# Patient Record
Sex: Male | Born: 1980 | Race: White | Hispanic: No | Marital: Single | Smoking: Current every day smoker
Health system: Southern US, Community
[De-identification: ages and names within clinical notes are randomized; demographics above are authoritative.]

## PROBLEM LIST (undated history)

## (undated) DIAGNOSIS — Z8669 Personal history of other diseases of the nervous system and sense organs: Secondary | ICD-10-CM

## (undated) DIAGNOSIS — M549 Dorsalgia, unspecified: Secondary | ICD-10-CM

## (undated) DIAGNOSIS — G8929 Other chronic pain: Secondary | ICD-10-CM

## (undated) DIAGNOSIS — R200 Anesthesia of skin: Secondary | ICD-10-CM

## (undated) DIAGNOSIS — Z789 Other specified health status: Secondary | ICD-10-CM

---

## 2013-07-04 ENCOUNTER — Other Ambulatory Visit (HOSPITAL_COMMUNITY): Payer: Self-pay | Admitting: Orthopaedic Surgery

## 2013-07-05 ENCOUNTER — Other Ambulatory Visit (HOSPITAL_COMMUNITY): Payer: Self-pay | Admitting: Orthopaedic Surgery

## 2013-07-16 ENCOUNTER — Encounter (HOSPITAL_COMMUNITY)
Admission: RE | Admit: 2013-07-16 | Discharge: 2013-07-16 | Disposition: A | Discharge status: Home / Self Care | Payer: Managed Care, Other (non HMO) | Source: Ambulatory Visit | Attending: Orthopaedic Surgery | Admitting: Orthopaedic Surgery

## 2013-07-16 ENCOUNTER — Encounter (HOSPITAL_COMMUNITY): Payer: Self-pay

## 2013-07-16 ENCOUNTER — Encounter (HOSPITAL_COMMUNITY): Payer: Self-pay | Admitting: Pharmacy Technician

## 2013-07-16 DIAGNOSIS — Z01818 Encounter for other preprocedural examination: Secondary | ICD-10-CM | POA: Insufficient documentation

## 2013-07-16 DIAGNOSIS — Z01812 Encounter for preprocedural laboratory examination: Secondary | ICD-10-CM | POA: Insufficient documentation

## 2013-07-16 HISTORY — DX: Anesthesia of skin: R20.0

## 2013-07-16 HISTORY — DX: Dorsalgia, unspecified: M54.9

## 2013-07-16 HISTORY — DX: Personal history of other diseases of the nervous system and sense organs: Z86.69

## 2013-07-16 HISTORY — DX: Other chronic pain: G89.29

## 2013-07-16 LAB — SURGICAL PCR SCREEN
MRSA, PCR: NEGATIVE
Staphylococcus aureus: NEGATIVE

## 2013-07-16 LAB — COMPREHENSIVE METABOLIC PANEL
ALT: 22 U/L (ref 0–53)
Alkaline Phosphatase: 34 U/L — ABNORMAL LOW (ref 39–117)
CO2: 28 mEq/L (ref 19–32)
GFR calc Af Amer: >90 mL/min (ref >90)
GFR calc non Af Amer: >90 mL/min (ref >90)
Glucose, Bld: 64 mg/dL — ABNORMAL LOW (ref 70–99)
Potassium: 4.1 mEq/L (ref 3.5–5.1)
Sodium: 139 mEq/L (ref 135–145)

## 2013-07-16 LAB — CBC
Hemoglobin: 16.2 g/dL (ref 13.0–17.0)
RBC: 5.18 MIL/uL (ref 4.22–5.81)

## 2013-07-16 NOTE — Pre-Procedure Instructions (Signed)
Caleb Vazquez  07/16/2013   Your procedure is scheduled on:  Fri, Sept 26 @ 10:35 AM  Report to [facility] at 8:30 AM.  Call this number if you have problems the morning of surgery: [phone]   Remember:   Do not eat food or drink liquids after midnight.   Take these medicines the morning of surgery with A SIP OF WATER: Ultram(Tramadol)              No Goody's,BC's,Aspirin,Aleve,Ibuprofen,Fish Oil,or any Herbal Medications   Do not wear jewelry  Do not wear lotions, powders, or colognes. You may wear deodorant.  Men may shave face and neck.  Do not bring valuables to the hospital.  [facility] is not responsible                   for any belongings or valuables.  Contacts, dentures or bridgework may not be worn into surgery.  Leave suitcase in the car. After surgery it may be brought to your room.  For patients admitted to the hospital, checkout time is 11:00 AM the day of  discharge.   Patients discharged the day of surgery will not be allowed to drive  home.    Special Instructions: Shower using CHG 2 nights before surgery and the night before surgery.  If you shower the day of surgery use CHG.  Use special wash - you have one bottle of CHG for all showers.  You should use approximately 1/3 of the bottle for each shower.   Please read over the following fact sheets that you were given: Pain Booklet, Coughing and Deep Breathing, MRSA Information and Surgical Site Infection Prevention

## 2013-07-16 NOTE — Progress Notes (Signed)
Pt doesn't have a cardiologist  Denies ever having a stress test/echo/heart cath  Pt doesn't have a medical md  Denies ekg or cxr in past yr

## 2013-07-17 ENCOUNTER — Other Ambulatory Visit (HOSPITAL_COMMUNITY): Payer: Self-pay

## 2013-07-22 NOTE — H&P (Signed)
  [facility]   A Division of [employer] [facility]   [address] [place], [place] [address] Telephone: [phone]  Fax: [phone]     PATIENT: Caleb Vazquez, Caleb Vazquez   MR#: [id number]  DOB: [birth year]       He is having persistent problems with his back.  He states he really has not gotten much relief.  He gets some temporary improvement with therapy.  Several hours later he gets more pain.  Pain is in the back, lumbosacral junction.  He had some problems with occasional back pain in the past but never associated with leg pain like he is having with the right buttock pain and posterolateral thigh pain, and pain intermittently radiates down past his knee down to his ankle.  He is able to heel and toe walk.  He is comfortable when he is in the supine position.  He has continued to work. He has had an epidural steroid injection, got relief for a couple of days to some degree, still had weakness in his leg and has trouble going up and down steps and then states he has had progressive worsening pain, continued numbness and weakness in his leg, painless straight leg raising.  He has continued having symptoms now for 4 months.  He has run out of his Ultram and requests an increase of the dosage.  We will send in 40 tablets 1-2 every 6 hours as needed for pain.  He has a large disk herniation on the right at L5-S1 with significant compression. IMAGING:  MRI scan shows a large disk herniation at L5-S1 on the right with significant compression of the right S1 nerve root consistent with his symptoms.  He has a shallow disk protrusion at L4-5 without compression.       ALLERGIES:  He has no allergies.   PAST MEDICAL/SURGICAL HISTORY:  No previous surgeries.   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  He is single.  Does not smoke, rarely drinks.     FAMILY HISTORY:  Positive for unknown type of cancer.    PHYSICAL EXAMINATION:  Patient is  alert and oriented, WD, WN, NAD, extraocular movement is intact.  Lungs are clear.  Abdomen is soft, nontender.  He is 6 feet 2 inches, thin, 140 pounds.  Lumbar skin is normal.  He has sciatic notch tenderness on the right.  Positive straight leg raising at 45 degrees.  He fatigues out with gastrocsoleus testing on the right after 2 ankle pumps.  He can do 30 easily with the left.  Positive popliteal compression test.  Peroneal is weak on the right only.  Anterior tib, EHL is normal.     ASSESSMENT:  Large HNP, L5-S1, failed conservative treatment, anti-inflammatories, pain medication, epidural steroid injections.  It is bothering him on a daily basis.  He still has been working.  He is on restrictions for limited bending, twisting, which is a problem for his job.     PLAN:  We discussed options.  He would like to discuss operative intervention with his family members.  He had a brother who had back surgery who got great relief, degenerative disk problems run in his family and MRI scan showed large disk herniation and compression.  Patient will proceed with right L5-S1 microdiscectomy.        Mark C. Ophelia Charter, M.D.    Auto-Authenticated by Veverly Fells. Ophelia Charter, M.D.

## 2013-07-25 MED ORDER — CEFAZOLIN SODIUM-DEXTROSE 2-3 GM-% IV SOLR
2.0000 g | INTRAVENOUS | Status: AC
  Administered 2013-07-26: 2 g via INTRAVENOUS
  Filled 2013-07-25: qty 50

## 2013-07-26 ENCOUNTER — Ambulatory Visit (HOSPITAL_COMMUNITY): Payer: Managed Care, Other (non HMO) | Admitting: Certified Registered Nurse Anesthetist

## 2013-07-26 ENCOUNTER — Encounter (HOSPITAL_COMMUNITY): Payer: Self-pay | Admitting: Certified Registered Nurse Anesthetist

## 2013-07-26 ENCOUNTER — Observation Stay (HOSPITAL_COMMUNITY)
Admission: RE | Admit: 2013-07-26 | Discharge: 2013-07-27 | Disposition: A | Discharge status: Home / Self Care | Payer: Managed Care, Other (non HMO) | Source: Ambulatory Visit | Attending: Orthopaedic Surgery | Admitting: Orthopaedic Surgery

## 2013-07-26 ENCOUNTER — Encounter (HOSPITAL_COMMUNITY)
Admission: RE | Disposition: A | Discharge status: Home / Self Care | Payer: Self-pay | Source: Ambulatory Visit | Attending: Orthopaedic Surgery

## 2013-07-26 ENCOUNTER — Ambulatory Visit (HOSPITAL_COMMUNITY): Payer: Managed Care, Other (non HMO)

## 2013-07-26 DIAGNOSIS — M5126 Other intervertebral disc displacement, lumbar region: Principal | ICD-10-CM | POA: Diagnosis present

## 2013-07-26 HISTORY — PX: LUMBAR LAMINECTOMY: SHX95

## 2013-07-26 HISTORY — DX: Other specified health status: Z78.9

## 2013-07-26 SURGERY — MICRODISCECTOMY LUMBAR LAMINECTOMY
Anesthesia: General | Site: Back | Laterality: Right | Wound class: Clean

## 2013-07-26 MED ORDER — BISACODYL 10 MG RE SUPP: 10.0000 mg | Freq: Every day | RECTAL | Status: DC | PRN

## 2013-07-26 MED ORDER — SODIUM CHLORIDE 0.9 % IV SOLN: 250.0000 mL | INTRAVENOUS | Status: DC

## 2013-07-26 MED ORDER — ACETAMINOPHEN 650 MG RE SUPP: 650.0000 mg | RECTAL | Status: DC | PRN

## 2013-07-26 MED ORDER — PROPOFOL 10 MG/ML IV BOLUS
INTRAVENOUS | Status: DC | PRN
  Administered 2013-07-26: 200 mg via INTRAVENOUS

## 2013-07-26 MED ORDER — CEFAZOLIN SODIUM 1-5 GM-% IV SOLN
1.0000 g | Freq: Three times a day (TID) | INTRAVENOUS | Status: AC
  Administered 2013-07-26 – 2013-07-27 (×2): 1 g via INTRAVENOUS
  Filled 2013-07-26 (×2): qty 50

## 2013-07-26 MED ORDER — SODIUM CHLORIDE 0.9 % IJ SOLN: 3.0000 mL | Freq: Two times a day (BID) | INTRAMUSCULAR | Status: DC

## 2013-07-26 MED ORDER — PROMETHAZINE HCL 25 MG/ML IJ SOLN: 6.2500 mg | INTRAMUSCULAR | Status: DC | PRN

## 2013-07-26 MED ORDER — ZOLPIDEM TARTRATE 5 MG PO TABS: 5.0000 mg | ORAL_TABLET | Freq: Every evening | ORAL | Status: DC | PRN

## 2013-07-26 MED ORDER — OXYCODONE HCL 5 MG PO TABS: 5.0000 mg | ORAL_TABLET | Freq: Once | ORAL | Status: DC | PRN

## 2013-07-26 MED ORDER — METHOCARBAMOL 100 MG/ML IJ SOLN
500.0000 mg | Freq: Four times a day (QID) | INTRAVENOUS | Status: DC | PRN
  Filled 2013-07-26: qty 5

## 2013-07-26 MED ORDER — HYDROCODONE-ACETAMINOPHEN 5-325 MG PO TABS: 1.0000 | ORAL_TABLET | ORAL | Status: DC | PRN

## 2013-07-26 MED ORDER — METHOCARBAMOL 500 MG PO TABS: 500.0000 mg | ORAL_TABLET | Freq: Four times a day (QID) | ORAL | Status: DC | PRN

## 2013-07-26 MED ORDER — 0.9 % SODIUM CHLORIDE (POUR BTL) OPTIME
TOPICAL | Status: DC | PRN
  Administered 2013-07-26: 1000 mL

## 2013-07-26 MED ORDER — KETOROLAC TROMETHAMINE 30 MG/ML IJ SOLN
30.0000 mg | Freq: Four times a day (QID) | INTRAMUSCULAR | Status: DC
  Administered 2013-07-26 – 2013-07-27 (×3): 30 mg via INTRAVENOUS
  Filled 2013-07-26 (×4): qty 1

## 2013-07-26 MED ORDER — ACETAMINOPHEN 325 MG PO TABS: 650.0000 mg | ORAL_TABLET | ORAL | Status: DC | PRN

## 2013-07-26 MED ORDER — SENNOSIDES-DOCUSATE SODIUM 8.6-50 MG PO TABS: 1.0000 | ORAL_TABLET | Freq: Every evening | ORAL | Status: DC | PRN

## 2013-07-26 MED ORDER — LACTATED RINGERS IV SOLN
INTRAVENOUS | Status: DC
  Administered 2013-07-26: 09:00:00 via INTRAVENOUS

## 2013-07-26 MED ORDER — LIDOCAINE HCL (CARDIAC) 20 MG/ML IV SOLN
INTRAVENOUS | Status: DC | PRN
  Administered 2013-07-26: 65 mg via INTRAVENOUS

## 2013-07-26 MED ORDER — MORPHINE SULFATE 2 MG/ML IJ SOLN: 1.0000 mg | INTRAMUSCULAR | Status: DC | PRN

## 2013-07-26 MED ORDER — PHENOL 1.4 % MT LIQD: 1.0000 | OROMUCOSAL | Status: DC | PRN

## 2013-07-26 MED ORDER — ALUM & MAG HYDROXIDE-SIMETH 200-200-20 MG/5ML PO SUSP: 30.0000 mL | Freq: Four times a day (QID) | ORAL | Status: DC | PRN

## 2013-07-26 MED ORDER — MIDAZOLAM HCL 5 MG/5ML IJ SOLN
INTRAMUSCULAR | Status: DC | PRN
  Administered 2013-07-26: 2 mg via INTRAVENOUS

## 2013-07-26 MED ORDER — DOCUSATE SODIUM 100 MG PO CAPS
100.0000 mg | ORAL_CAPSULE | Freq: Two times a day (BID) | ORAL | Status: DC
  Administered 2013-07-26 – 2013-07-27 (×2): 100 mg via ORAL
  Filled 2013-07-26 (×3): qty 1

## 2013-07-26 MED ORDER — LACTATED RINGERS IV SOLN
INTRAVENOUS | Status: DC | PRN
  Administered 2013-07-26 (×2): via INTRAVENOUS

## 2013-07-26 MED ORDER — ARTIFICIAL TEARS OP OINT
TOPICAL_OINTMENT | OPHTHALMIC | Status: DC | PRN
  Administered 2013-07-26: 1 via OPHTHALMIC

## 2013-07-26 MED ORDER — ONDANSETRON HCL 4 MG/2ML IJ SOLN
INTRAMUSCULAR | Status: DC | PRN
  Administered 2013-07-26: 4 mg via INTRAVENOUS

## 2013-07-26 MED ORDER — TRAMADOL HCL 50 MG PO TABS: 50.0000 mg | ORAL_TABLET | ORAL | Status: DC | PRN

## 2013-07-26 MED ORDER — OXYCODONE-ACETAMINOPHEN 5-325 MG PO TABS: 1.0000 | ORAL_TABLET | ORAL | Status: DC | PRN

## 2013-07-26 MED ORDER — BUPIVACAINE HCL (PF) 0.25 % IJ SOLN
INTRAMUSCULAR | Status: DC | PRN
  Administered 2013-07-26: 8 mL

## 2013-07-26 MED ORDER — SODIUM CHLORIDE 0.9 % IJ SOLN: 3.0000 mL | INTRAMUSCULAR | Status: DC | PRN

## 2013-07-26 MED ORDER — MENTHOL 3 MG MT LOZG: 1.0000 | LOZENGE | OROMUCOSAL | Status: DC | PRN

## 2013-07-26 MED ORDER — OXYCODONE-ACETAMINOPHEN 5-325 MG PO TABS
1.0000 | ORAL_TABLET | ORAL | Status: DC | PRN
  Administered 2013-07-26: 2 via ORAL
  Filled 2013-07-26: qty 2

## 2013-07-26 MED ORDER — PANTOPRAZOLE SODIUM 40 MG IV SOLR
40.0000 mg | Freq: Every day | INTRAVENOUS | Status: DC
  Administered 2013-07-26: 40 mg via INTRAVENOUS
  Filled 2013-07-26 (×2): qty 40

## 2013-07-26 MED ORDER — OXYCODONE HCL 5 MG/5ML PO SOLN: 5.0000 mg | Freq: Once | ORAL | Status: DC | PRN

## 2013-07-26 MED ORDER — GLYCOPYRROLATE 0.2 MG/ML IJ SOLN
INTRAMUSCULAR | Status: DC | PRN
  Administered 2013-07-26: .8 mg via INTRAVENOUS

## 2013-07-26 MED ORDER — BUPIVACAINE HCL (PF) 0.25 % IJ SOLN
INTRAMUSCULAR | Status: AC
  Filled 2013-07-26: qty 30

## 2013-07-26 MED ORDER — HYDROMORPHONE HCL PF 1 MG/ML IJ SOLN
0.2500 mg | INTRAMUSCULAR | Status: DC | PRN
  Administered 2013-07-26: 0.5 mg via INTRAVENOUS

## 2013-07-26 MED ORDER — KCL IN DEXTROSE-NACL 20-5-0.45 MEQ/L-%-% IV SOLN
INTRAVENOUS | Status: DC
  Filled 2013-07-26 (×4): qty 1000

## 2013-07-26 MED ORDER — FLEET ENEMA 7-19 GM/118ML RE ENEM: 1.0000 | ENEMA | Freq: Once | RECTAL | Status: AC | PRN

## 2013-07-26 MED ORDER — HYDROMORPHONE HCL PF 1 MG/ML IJ SOLN
INTRAMUSCULAR | Status: AC
  Administered 2013-07-26: 0.5 mg
  Filled 2013-07-26: qty 1

## 2013-07-26 MED ORDER — ROCURONIUM BROMIDE 100 MG/10ML IV SOLN
INTRAVENOUS | Status: DC | PRN
  Administered 2013-07-26: 50 mg via INTRAVENOUS

## 2013-07-26 MED ORDER — NEOSTIGMINE METHYLSULFATE 1 MG/ML IJ SOLN
INTRAMUSCULAR | Status: DC | PRN
  Administered 2013-07-26: 5 mg via INTRAVENOUS

## 2013-07-26 MED ORDER — FENTANYL CITRATE 0.05 MG/ML IJ SOLN
INTRAMUSCULAR | Status: DC | PRN
  Administered 2013-07-26: 50 ug via INTRAVENOUS
  Administered 2013-07-26: 100 ug via INTRAVENOUS

## 2013-07-26 MED ORDER — METOCLOPRAMIDE HCL 5 MG/ML IJ SOLN
INTRAMUSCULAR | Status: DC | PRN
  Administered 2013-07-26: 10 mg via INTRAVENOUS

## 2013-07-26 MED ORDER — ONDANSETRON HCL 4 MG/2ML IJ SOLN: 4.0000 mg | INTRAMUSCULAR | Status: DC | PRN

## 2013-07-26 SURGICAL SUPPLY — 51 items
BUR ROUND FLUTED 4 SOFT TCH (BURR) IMPLANT
CANISTER SUCTION 2500CC (MISCELLANEOUS) ×2 IMPLANT
CLOTH BEACON ORANGE TIMEOUT ST (SAFETY) IMPLANT
CORDS BIPOLAR (ELECTRODE) ×2 IMPLANT
COVER SURGICAL LIGHT HANDLE (MISCELLANEOUS) ×2 IMPLANT
DERMABOND ADVANCED (GAUZE/BANDAGES/DRESSINGS) ×1
DERMABOND ADVANCED .7 DNX12 (GAUZE/BANDAGES/DRESSINGS) ×1 IMPLANT
DRAPE MICROSCOPE LEICA (MISCELLANEOUS) ×2 IMPLANT
DRAPE PROXIMA HALF (DRAPES) ×4 IMPLANT
DRSG EMULSION OIL 3X3 NADH (GAUZE/BANDAGES/DRESSINGS) IMPLANT
DRSG MEPILEX BORDER 4X4 (GAUZE/BANDAGES/DRESSINGS) ×2 IMPLANT
DRSG MEPILEX BORDER 4X8 (GAUZE/BANDAGES/DRESSINGS) IMPLANT
DURAPREP 26ML APPLICATOR (WOUND CARE) ×2 IMPLANT
ELECT REM PT RETURN 9FT ADLT (ELECTROSURGICAL) ×2
ELECTRODE REM PT RTRN 9FT ADLT (ELECTROSURGICAL) ×1 IMPLANT
GLOVE BIOGEL PI IND STRL 7.5 (GLOVE) ×1 IMPLANT
GLOVE BIOGEL PI IND STRL 8 (GLOVE) ×1 IMPLANT
GLOVE BIOGEL PI INDICATOR 7.5 (GLOVE) ×1
GLOVE BIOGEL PI INDICATOR 8 (GLOVE) ×1
GLOVE ECLIPSE 7.0 STRL STRAW (GLOVE) ×2 IMPLANT
GLOVE ORTHO TXT STRL SZ7.5 (GLOVE) ×2 IMPLANT
GLOVE SURG SS PI 8.5 STRL IVOR (GLOVE) ×1
GLOVE SURG SS PI 8.5 STRL STRW (GLOVE) ×1 IMPLANT
GOWN PREVENTION PLUS LG XLONG (DISPOSABLE) ×2 IMPLANT
GOWN STRL NON-REIN LRG LVL3 (GOWN DISPOSABLE) ×2 IMPLANT
GOWN STRL REIN 2XL XLG LVL4 (GOWN DISPOSABLE) ×2 IMPLANT
KIT BASIN OR (CUSTOM PROCEDURE TRAY) ×2 IMPLANT
KIT ROOM TURNOVER OR (KITS) ×2 IMPLANT
MANIFOLD NEPTUNE II (INSTRUMENTS) IMPLANT
NDL SUT .5 MAYO 1.404X.05X (NEEDLE) IMPLANT
NEEDLE 22X1 1/2 (OR ONLY) (NEEDLE) ×2 IMPLANT
NEEDLE MAYO TAPER (NEEDLE)
NEEDLE SPNL 18GX3.5 QUINCKE PK (NEEDLE) ×2 IMPLANT
NS IRRIG 1000ML POUR BTL (IV SOLUTION) ×2 IMPLANT
PACK LAMINECTOMY ORTHO (CUSTOM PROCEDURE TRAY) ×2 IMPLANT
PAD ARMBOARD 7.5X6 YLW CONV (MISCELLANEOUS) ×4 IMPLANT
PATTIES SURGICAL .5 X.5 (GAUZE/BANDAGES/DRESSINGS) IMPLANT
PATTIES SURGICAL .75X.75 (GAUZE/BANDAGES/DRESSINGS) IMPLANT
SPONGE GAUZE 4X4 12PLY (GAUZE/BANDAGES/DRESSINGS) IMPLANT
SUT VIC AB 0 CT1 27 (SUTURE) ×1
SUT VIC AB 0 CT1 27XBRD ANBCTR (SUTURE) ×1 IMPLANT
SUT VIC AB 2-0 CT1 27 (SUTURE) ×1
SUT VIC AB 2-0 CT1 TAPERPNT 27 (SUTURE) ×1 IMPLANT
SUT VICRYL 0 TIES 12 18 (SUTURE) IMPLANT
SUT VICRYL 4-0 PS2 18IN ABS (SUTURE) ×2 IMPLANT
SUT VICRYL AB 2 0 TIES (SUTURE) IMPLANT
SYR 20ML ECCENTRIC (SYRINGE) IMPLANT
SYR CONTROL 10ML LL (SYRINGE) ×2 IMPLANT
TOWEL OR 17X24 6PK STRL BLUE (TOWEL DISPOSABLE) ×2 IMPLANT
TOWEL OR 17X26 10 PK STRL BLUE (TOWEL DISPOSABLE) ×2 IMPLANT
WATER STERILE IRR 1000ML POUR (IV SOLUTION) IMPLANT

## 2013-07-26 NOTE — Interval H&P Note (Signed)
History and Physical Interval Note:  07/26/2013 10:31 AM  Caleb Vazquez  has presented today for surgery, with the diagnosis of Right L5-S1 HNP  The various methods of treatment have been discussed with the patient and family. After consideration of risks, benefits and other options for treatment, the patient has consented to  Procedure(s) with comments: MICRODISCECTOMY LUMBAR LAMINECTOMY (Right) - Right L5-S1 Microdiscectomy as a surgical intervention .  The patient's history has been reviewed, patient examined, no change in status, stable for surgery.  I have reviewed the patient's chart and labs.  Questions were answered to the patient's satisfaction.     [name]

## 2013-07-26 NOTE — Preoperative (Signed)
Beta Blockers   Reason not to administer Beta Blockers:Not Applicable 

## 2013-07-26 NOTE — Transfer of Care (Signed)
Immediate Anesthesia Transfer of Care Note  Patient: Caleb Vazquez  Procedure(s) Performed: Procedure(s) with comments: MICRODISCECTOMY LUMBAR LAMINECTOMY (Right) - Right L5-S1 Microdiscectomy  Patient Location: PACU  Anesthesia Type:General  Level of Consciousness: awake, alert , oriented and patient cooperative  Airway & Oxygen Therapy: Patient Spontanous Breathing  Post-op Assessment: Report given to PACU RN, Post -op Vital signs reviewed and stable and Patient moving all extremities X 4  Post vital signs: Reviewed and stable  Complications: No apparent anesthesia complications

## 2013-07-26 NOTE — Anesthesia Preprocedure Evaluation (Signed)
Anesthesia Evaluation  Patient identified by MRN, date of birth, ID band Patient awake    Reviewed: Allergy & Precautions, H&P , NPO status , Patient's Chart, lab work & pertinent test results  History of Anesthesia Complications Negative for: history of anesthetic complications  Airway Mallampati: I      Dental  (+) Teeth Intact   Pulmonary  breath sounds clear to auscultation        Cardiovascular negative cardio ROS  Rhythm:Regular Rate:Normal     Neuro/Psych    GI/Hepatic negative GI ROS, Neg liver ROS,   Endo/Other  negative endocrine ROS  Renal/GU negative Renal ROS     Musculoskeletal negative musculoskeletal ROS (+)   Abdominal   Peds  Hematology negative hematology ROS (+)   Anesthesia Other Findings   Reproductive/Obstetrics                           Anesthesia Physical Anesthesia Plan  ASA: I  Anesthesia Plan: General   Post-op Pain Management:    Induction: Intravenous  Airway Management Planned: Oral ETT  Additional Equipment:   Intra-op Plan:   Post-operative Plan: Extubation in OR  Informed Consent: I have reviewed the patients History and Physical, chart, labs and discussed the procedure including the risks, benefits and alternatives for the proposed anesthesia with the patient or authorized representative who has indicated his/her understanding and acceptance.   Dental advisory given  Plan Discussed with: CRNA and Surgeon  Anesthesia Plan Comments:         Anesthesia Quick Evaluation

## 2013-07-26 NOTE — Brief Op Note (Cosign Needed)
07/26/2013  2:27 PM  PATIENT:  Caleb Vazquez  32 y.o. male  PRE-OPERATIVE DIAGNOSIS:  Right L5-S1 HNP  POST-OPERATIVE DIAGNOSIS:  Right L5-S1 HNP  PROCEDURE:  Procedure(s) with comments: MICRODISCECTOMY LUMBAR LAMINECTOMY (Right) - Right L5-S1 Microdiscectomy  SURGEON:  Surgeon(s) and Role:    * Eldred Manges, MD - Primary  PHYSICIAN ASSISTANT: Maud Deed PAC  ASSISTANTS: none   ANESTHESIA:   general  EBL:  Total I/O In: 1000 [I.V.:1000] Out: -   BLOOD ADMINISTERED:none  DRAINS: none   LOCAL MEDICATIONS USED:  MARCAINE     SPECIMEN:  No Specimen  DISPOSITION OF SPECIMEN:  N/A  COUNTS:  YES  TOURNIQUET:  * No tourniquets in log *  DICTATION: .Note written in EPIC  PLAN OF CARE: Admit for overnight observation  PATIENT DISPOSITION:  PACU - hemodynamically stable.   Delay start of Pharmacological VTE agent (>24hrs) due to surgical blood loss or risk of bleeding: yes

## 2013-07-26 NOTE — Anesthesia Postprocedure Evaluation (Signed)
Anesthesia Post Note  Patient: Caleb Vazquez  Procedure(s) Performed: Procedure(s) (LRB): MICRODISCECTOMY LUMBAR LAMINECTOMY (Right)  Anesthesia type: General  Patient location: PACU  Post pain: Pain level controlled and Adequate analgesia  Post assessment: Post-op Vital signs reviewed, Patient's Cardiovascular Status Stable, Respiratory Function Stable, Patent Airway and Pain level controlled  Last Vitals:  Filed Vitals:   07/26/13 1530  BP:   Pulse:   Temp: 36.9 C  Resp:     Post vital signs: Reviewed and stable  Level of consciousness: awake, alert  and oriented  Complications: No apparent anesthesia complications

## 2013-07-27 NOTE — Discharge Summary (Signed)
Physician Discharge Summary  Patient ID: Caleb Vazquez MRN: [id number] DOB/AGE: 02-08-1981 32 y.o.  Admit date: 07/26/2013 Discharge date: 07/27/2013  Admission Diagnoses: Right L5-S1 herniated nucleus pulposus with radiculopathy  Discharge Diagnoses: Same Principal Problem:   HNP (herniated nucleus pulposus), lumbar   Discharged Condition: Improved  Hospital Course: Patient underwent right L5-S1 microdiscectomy for removal of large disc herniation with severe compression. Postop complete relief of leg pain and weakness. He was ambulatory in the halls minimal incisional pain and had his dressing changed.  Consults: None  Significant Diagnostic Studies:   Treatments: surgery: As above  Discharge Exam: Blood pressure 118/69, pulse 56, temperature 98.3 F (36.8 C), temperature source Oral, resp. rate 16, SpO2 97.00%. Extremities: Negative straight leg raising 80 no isolated motor weakness normal sensation right and left lower extremity  Disposition: Final discharge disposition not confirmed     Medication List         methocarbamol 500 MG tablet  Commonly known as:  ROBAXIN  Take 1 tablet (500 mg total) by mouth every 6 (six) hours as needed (spasm).     oxyCODONE-acetaminophen 5-325 MG per tablet  Commonly known as:  ROXICET  Take 1-2 tablets by mouth every 4 (four) hours as needed for pain.     traMADol 50 MG tablet  Commonly known as:  ULTRAM  Take 50-100 mg by mouth every 4 (four) hours as needed for pain.           Follow-up Information   Follow up with Eldred Manges, MD. Schedule an appointment as soon as possible for a visit in 2 weeks.   Specialty:  Orthopedic Surgery   Contact information:   [address] [place] [address] [phone]       Signed: Eldred Manges 07/27/2013, 9:44 AM

## 2013-07-27 NOTE — Op Note (Signed)
NAMEMarland Vazquez  ORVIE, CARADINE NO.:  [id number]  MEDICAL RECORD NO.:  [id number]  LOCATION:  5N06C                        FACILITY:  [facility]  PHYSICIAN:  Mark C. Ophelia Charter, M.D.    DATE OF BIRTH:  04/10/1981  DATE OF PROCEDURE:  07/26/2013 DATE OF DISCHARGE:                              OPERATIVE REPORT   PREOPERATIVE DIAGNOSIS:  Right L5-S1 microdiskectomy for large herniated nucleus pulposus.  POSTOPERATIVE DIAGNOSIS:  Right L5-S1 microdiskectomy for large herniated nucleus pulposus.  SURGEON:  Mark C. Ophelia Charter, M.D.  ASSISTANT:  Maud Deed, PA-C, medically necessary and present for the entire procedure.  ANESTHESIA:  GOT plus Marcaine skin local.  COMPLICATIONS:  None.  INDICATION:  A 32 year old male with persistent problems with back and leg pain, very large extruded fragment occupying about half the canal at 5-1 level on the right side with persistent S1 radiculopathy present for more than 4 months with progressive increased weakness, pain, and numbness.  PROCEDURE:  After induction of general anesthesia, the patient was placed in a prone position.  Careful padding and positioning.  Back was prepped with DuraPrep.  The area was squared with towels, Betadine, Steri-Drape applied.  Needle localization was originally placed, but the x-ray machine was not functioning properly.  The patient was extremely thin, easy to palpate 5-1 interspace, and small incision was made after time-out procedure, just the right of midline.  Subperiosteal dissection, placement of a Taylor retractor laterally was performed.  At this time, machine was either corrected or another machine was brought in for x-ray and cross-table lateral x-ray confirmed that we were at the 5-1 level as expected.  Laminotomy was performed.  Ligament was opened and removed.  Nerve root was extremely tight and did not want to be mobilized out of its position where it was dorsally displaced and laterally  displaced.  Little bit more lamina was removed proximally and then following down to the floor for the L5 body posterior aspect of the body was followed to the level the disk space.  The area of disk was visualized, open slightly with the tip of a nerve hook, and immediately some disk material extruded.  This then gradually extended caudally and the very large fragment, which was 4 x 2 x 1.5 cm was gradually teased out from around the nerve root and delivered.  Remaining chunks were taken, but base of the clavicle large fragment was removed.  Nerve root was decompressed.  Foramina was opened.  Passes were made in the midline with minimal remaining disk material.  Hockey stick could be passed anterior to the dura 180 degrees without areas of compression.  Dura was intact.  The area was irrigated.  Some veins in the lateral gutter and the sac were coagulated.  No remaining areas of compression and there was no protrusion in the midline.  Repeat irrigation, fascia closure with #1 Vicryl, 2-0 Vicryl subcutaneous tissue, subcuticular skin closure, postop dressing Dermabond.  Transferred to the recovery room in stable condition.  Instrument count and needle count was correct.     Mark C. Ophelia Charter, M.D.     MCY/MEDQ  D:  07/26/2013  T:  07/27/2013  Job:  [id number]

## 2013-07-30 ENCOUNTER — Encounter (HOSPITAL_COMMUNITY): Payer: Self-pay | Admitting: Orthopaedic Surgery

## 2014-02-21 ENCOUNTER — Emergency Department (HOSPITAL_COMMUNITY): Payer: Managed Care, Other (non HMO)

## 2014-02-21 ENCOUNTER — Emergency Department (HOSPITAL_COMMUNITY)
Admission: EM | Admit: 2014-02-21 | Discharge: 2014-02-22 | Disposition: A | Discharge status: Home / Self Care | Payer: Managed Care, Other (non HMO) | Attending: Emergency Medicine | Admitting: Emergency Medicine

## 2014-02-21 ENCOUNTER — Encounter (HOSPITAL_COMMUNITY): Payer: Self-pay | Admitting: Emergency Medicine

## 2014-02-21 DIAGNOSIS — F17200 Nicotine dependence, unspecified, uncomplicated: Secondary | ICD-10-CM | POA: Insufficient documentation

## 2014-02-21 DIAGNOSIS — Y929 Unspecified place or not applicable: Secondary | ICD-10-CM | POA: Insufficient documentation

## 2014-02-21 DIAGNOSIS — Z23 Encounter for immunization: Secondary | ICD-10-CM | POA: Insufficient documentation

## 2014-02-21 DIAGNOSIS — Z8679 Personal history of other diseases of the circulatory system: Secondary | ICD-10-CM | POA: Insufficient documentation

## 2014-02-21 DIAGNOSIS — Z79899 Other long term (current) drug therapy: Secondary | ICD-10-CM | POA: Insufficient documentation

## 2014-02-21 DIAGNOSIS — S61459A Open bite of unspecified hand, initial encounter: Secondary | ICD-10-CM

## 2014-02-21 DIAGNOSIS — S62329B Displaced fracture of shaft of unspecified metacarpal bone, initial encounter for open fracture: Secondary | ICD-10-CM | POA: Insufficient documentation

## 2014-02-21 DIAGNOSIS — L0390 Cellulitis, unspecified: Secondary | ICD-10-CM

## 2014-02-21 DIAGNOSIS — G8929 Other chronic pain: Secondary | ICD-10-CM | POA: Insufficient documentation

## 2014-02-21 DIAGNOSIS — L02519 Cutaneous abscess of unspecified hand: Secondary | ICD-10-CM | POA: Insufficient documentation

## 2014-02-21 DIAGNOSIS — S62309B Unspecified fracture of unspecified metacarpal bone, initial encounter for open fracture: Secondary | ICD-10-CM

## 2014-02-21 DIAGNOSIS — L03119 Cellulitis of unspecified part of limb: Secondary | ICD-10-CM

## 2014-02-21 DIAGNOSIS — W540XXA Bitten by dog, initial encounter: Secondary | ICD-10-CM | POA: Insufficient documentation

## 2014-02-21 DIAGNOSIS — Y9389 Activity, other specified: Secondary | ICD-10-CM | POA: Insufficient documentation

## 2014-02-21 MED ORDER — IBUPROFEN 400 MG PO TABS
800.0000 mg | ORAL_TABLET | Freq: Once | ORAL | Status: AC
  Administered 2014-02-22: 800 mg via ORAL
  Filled 2014-02-21: qty 4

## 2014-02-21 MED ORDER — AMOXICILLIN-POT CLAVULANATE 875-125 MG PO TABS
1.0000 | ORAL_TABLET | Freq: Once | ORAL | Status: AC
  Administered 2014-02-22: 1 via ORAL
  Filled 2014-02-21: qty 1

## 2014-02-21 MED ORDER — TETANUS-DIPHTH-ACELL PERTUSSIS 5-2.5-18.5 LF-MCG/0.5 IM SUSP
0.5000 mL | Freq: Once | INTRAMUSCULAR | Status: AC
  Administered 2014-02-22: 0.5 mL via INTRAMUSCULAR
  Filled 2014-02-21: qty 0.5

## 2014-02-21 NOTE — ED Notes (Signed)
The pt lt hand was bitten by his girlfriends dog Wednesday night.  He has a puncture wound at the base of his lt ring finger  Mid shaft metacarpal and the movement of the lt ring and little fingers is decreased.  He is able to flex   And extend but not  Totally.  Minimal swelling  Hand color good
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
## 2014-02-21 NOTE — ED Notes (Signed)
The pt was bitten by a pit bull dog

## 2014-02-22 MED ORDER — IBUPROFEN 800 MG PO TABS: 800.0000 mg | ORAL_TABLET | Freq: Once | ORAL | Status: DC

## 2014-02-22 MED ORDER — AMOXICILLIN-POT CLAVULANATE 875-125 MG PO TABS: 1.0000 | ORAL_TABLET | Freq: Two times a day (BID) | ORAL | Status: DC

## 2014-02-22 NOTE — Discharge Instructions (Signed)
Animal Bite °An animal bite can result in a scratch on the skin, deep open cut, puncture of the skin, crush injury, or tearing away of the skin or a body part. Dogs are responsible for most animal bites. Children are bitten more often than adults. An animal bite can range from very mild to more serious. A small bite from your house pet is no cause for alarm. However, some animal bites can become infected or injure a bone or other tissue. You must seek medical care if: °· The skin is broken and bleeding does not slow down or stop after 15 minutes. °· The puncture is deep and difficult to clean (such as a cat bite). °· Pain, warmth, redness, or pus develops around the wound. °· The bite is from a stray animal or rodent. There may be a risk of rabies infection. °· The bite is from a snake, raccoon, skunk, fox, coyote, or bat. There may be a risk of rabies infection. °· The person bitten has a chronic illness such as diabetes, liver disease, or cancer, or the person takes medicine that lowers the immune system. °· There is concern about the location and severity of the bite. °It is important to clean and protect an animal bite wound right away to prevent infection. Follow these steps: °· Clean the wound with plenty of water and soap. °· Apply an antibiotic cream. °· Apply gentle pressure over the wound with a clean towel or gauze to slow or stop bleeding. °· Elevate the affected area above the heart to help stop any bleeding. °· Seek medical care. Getting medical care within 8 hours of the animal bite leads to the best possible outcome. °DIAGNOSIS  °Your caregiver will most likely: °· Take a detailed history of the animal and the bite injury. °· Perform a wound exam. °· Take your medical history. °Blood tests or X-rays may be performed. Sometimes, infected bite wounds are cultured and sent to a lab to identify the infectious bacteria.  °TREATMENT  °Medical treatment will depend on the location and type of animal bite as  well as the patient's medical history. Treatment may include: °· Wound care, such as cleaning and flushing the wound with saline solution, bandaging, and elevating the affected area. °· Antibiotics. °· Tetanus immunization. °· Rabies immunization. °· Leaving the wound open to heal. This is often done with animal bites, due to the high risk of infection. However, in certain cases, wound closure with stitches, wound adhesive, skin adhesive strips, or staples may be used. ° Infected bites that are left untreated may require intravenous (IV) antibiotics and surgical treatment in the hospital. °HOME CARE INSTRUCTIONS °· Follow your caregiver's instructions for wound care. °· Take all medicines as directed. °· If your caregiver prescribes antibiotics, take them as directed. Finish them even if you start to feel better. °· Follow up with your caregiver for further exams or immunizations as directed. °You may need a tetanus shot if: °· You cannot remember when you had your last tetanus shot. °· You have never had a tetanus shot. °· The injury broke your skin. °If you get a tetanus shot, your arm may swell, get red, and feel warm to the touch. This is common and not a problem. If you need a tetanus shot and you choose not to have one, there is a rare chance of getting tetanus. Sickness from tetanus can be serious. °SEEK MEDICAL CARE IF: °· You notice warmth, redness, soreness, swelling, pus discharge, or a bad   smell coming from the wound.  You have a red line on the skin coming from the wound.  You have a fever, chills, or a general ill feeling.  You have nausea or vomiting.  You have continued or worsening pain.  You have trouble moving the injured part.  You have other questions or concerns. MAKE SURE YOU:  Understand these instructions.  Will watch your condition.  Will get help right away if you are not doing well or get worse. Document Released: 07/05/2011 Document Revised: 01/09/2012 Document  Reviewed: 07/05/2011 [facility] Patient Information 2014 [place], [place]. Hand Fracture, Fifth Metacarpal The small metacarpal is the bone at the base of the little finger between the knuckle and the wrist. A fracture is a break in that bone. One of the fractures that is common to this bone is called a Boxer's Fracture. TREATMENT These fractures can be treated with:   Reduction (bones moved back into place), then pinned through the skin to maintain the position, and then casted for about 6 weeks or as your caregiver determines necessary.  ORIF (open reduction and internal fixation) - the fracture site is opened and the bone pieces are fixed into place with pins and then casted for approximately 6 weeks or as your caregiver determines necessary. Your caregiver will discuss the type of fracture you have and the treatment that should be best for that problem. If surgery is the treatment of choice, the following is information for you to know, and also let your caregiver know about prior to surgery.  LET YOUR CAREGIVER KNOW ABOUT:  Allergies.  Medications taken including herbs, eye drops, over the counter medications, and creams.  Use of steroids (by mouth or creams).  Previous problems with anesthetics or novocaine.  Possibility of pregnancy, if this applies.  History of blood clots (thrombophlebitis).  History of bleeding or blood problems.  Previous surgery.  Other health problems. AFTER THE PROCEDURE After surgery, you will be taken to the recovery area where a nurse will watch and check your progress. Once you're awake, stable, and taking fluids well, barring other problems you'll be allowed to go home. Once home an ice pack applied to your operative site may help with discomfort and keep the swelling down. HOME CARE INSTRUCTIONS   Follow your caregiver's instructions as to activities, exercises, physical therapy, and driving a car.  Daily exercise is helpful for maintaining range  of motion (movement and mobility) and strength. Exercise as instructed.  To lessen swelling, keep the injured hand elevated above the level of your heart as much as possible.  Apply ice to the injury for 15-20 minutes each hour while awake for the first 2 days. Put the ice in a plastic bag and place a thin towel between the bag of ice and your cast.  Move the fingers of your casted hand at least several times a day.  If a plaster or fiberglass cast was applied:  Do not try to scratch the skin under the cast using a sharp or pointed object.  Check the skin around the cast every day. You may put lotion on red or sore areas.  Keep your cast dry. Your cast can be protected during bathing with a plastic bag. Do not put your cast into the water.  If a plaster splint was applied:  Wear the splint for as long as directed by your caregiver or until seen for follow-up examination.  Do not get your splint wet. Protect it during bathing with a  plastic bag.  You may loosen the elastic bandage around the splint if your fingers start to get numb, tingle, get cold or turn blue.  Do not put pressure on your cast or splint; this may cause it to break. Especially, do not lean plaster casts on hard surfaces for 24 hours after application.  Take medications as directed by your caregiver.  Only take over-the-counter or prescription medicines for pain, discomfort, or fever as directed by your caregiver.  Follow all instructions for physician referrals, physical therapy, and rehabilitation. Any delay in obtaining necessary care could result in permanent injury, disability and chronic pain. SEEK MEDICAL CARE IF:   Increased bleeding (more than a small spot) from the wound or from beneath your cast or splint if there is a wound beneath the cast from surgery.  Redness, swelling, or increasing pain in the wound or from beneath your cast or splint.  Pus coming from wound or from beneath your cast or  splint.  An unexplained oral temperature above 102 F (38.9 C) develops.  A foul smell coming from the wound or dressing or from beneath your cast or splint.  You are unable to move your little finger. SEEK IMMEDIATE MEDICAL CARE IF:  You develop a rash, have difficulty breathing, or have any allergy problems. If you do not have a window in your cast for observing the wound, a discharge or minor bleeding may show up as a stain on the outside of your cast. Report these findings to your caregiver. MAKE SURE YOU:   Understand these instructions.  Will watch your condition.  Will get help right away if you are not doing well or get worse. Document Released: 01/23/2001 Document Revised: 01/09/2012 Document Reviewed: 06/05/2008 [facility] Patient Information 2014 [place], [place].

## 2014-02-22 NOTE — ED Provider Notes (Signed)
33 year old male was bitten by a football 2 days ago. This occurred on his left hand. Hand has become more painful more swollen since then. On exam, there is a V-shaped laceration of the ulnar aspect of the left hand with mild erythema and swelling and moderate tenderness. X-ray shows a spiral fracture of the fifth metacarpal underlying this fracture. Patient states he did not fall, so the fracture had to have been from the dogs tooth. He does not appear toxic and that he is not febrile. Orthopedics will be consulted to consider admission for IV antibiotics and consideration for washing out the fracture.  Medical screening examination/treatment/procedure(s) were conducted as a shared visit with non-physician practitioner(s) and myself.  I personally evaluated the patient during the encounter.   Dione Boozeavid Delphina Schum, MD 02/22/14 [phone]

## 2014-02-24 ENCOUNTER — Other Ambulatory Visit (HOSPITAL_COMMUNITY): Payer: Self-pay | Admitting: Orthopaedic Surgery

## 2014-02-24 ENCOUNTER — Encounter (HOSPITAL_COMMUNITY): Payer: Self-pay | Admitting: Pharmacy Technician

## 2014-02-25 ENCOUNTER — Encounter (HOSPITAL_COMMUNITY): Payer: Self-pay | Admitting: *Deleted

## 2014-02-25 MED ORDER — CEFAZOLIN SODIUM-DEXTROSE 2-3 GM-% IV SOLR
2.0000 g | INTRAVENOUS | Status: AC
  Administered 2014-02-26: 2 g via INTRAVENOUS
  Filled 2014-02-25: qty 50

## 2014-02-26 ENCOUNTER — Encounter (HOSPITAL_COMMUNITY)
Admission: RE | Disposition: A | Discharge status: Home / Self Care | Payer: Self-pay | Source: Ambulatory Visit | Attending: Orthopaedic Surgery

## 2014-02-26 ENCOUNTER — Ambulatory Visit (HOSPITAL_COMMUNITY)
Admission: RE | Admit: 2014-02-26 | Discharge: 2014-02-26 | Disposition: A | Discharge status: Home / Self Care | Payer: Managed Care, Other (non HMO) | Source: Ambulatory Visit | Attending: Orthopaedic Surgery | Admitting: Orthopaedic Surgery

## 2014-02-26 ENCOUNTER — Ambulatory Visit (HOSPITAL_COMMUNITY): Payer: Managed Care, Other (non HMO)

## 2014-02-26 ENCOUNTER — Encounter (HOSPITAL_COMMUNITY): Payer: Self-pay | Admitting: Certified Registered"

## 2014-02-26 ENCOUNTER — Ambulatory Visit (HOSPITAL_COMMUNITY): Payer: Managed Care, Other (non HMO) | Admitting: Certified Registered"

## 2014-02-26 ENCOUNTER — Encounter (HOSPITAL_COMMUNITY): Payer: Managed Care, Other (non HMO) | Admitting: Certified Registered"

## 2014-02-26 DIAGNOSIS — W540XXA Bitten by dog, initial encounter: Secondary | ICD-10-CM | POA: Diagnosis not present

## 2014-02-26 DIAGNOSIS — S62309A Unspecified fracture of unspecified metacarpal bone, initial encounter for closed fracture: Secondary | ICD-10-CM | POA: Insufficient documentation

## 2014-02-26 DIAGNOSIS — F17200 Nicotine dependence, unspecified, uncomplicated: Secondary | ICD-10-CM | POA: Diagnosis not present

## 2014-02-26 DIAGNOSIS — Y929 Unspecified place or not applicable: Secondary | ICD-10-CM | POA: Diagnosis not present

## 2014-02-26 DIAGNOSIS — S62319A Displaced fracture of base of unspecified metacarpal bone, initial encounter for closed fracture: Secondary | ICD-10-CM

## 2014-02-26 HISTORY — PX: OPEN REDUCTION INTERNAL FIXATION (ORIF) METACARPAL: SHX6234

## 2014-02-26 LAB — CBC
HCT: 45.7 % (ref 39.0–52.0)
Hemoglobin: 15.5 g/dL (ref 13.0–17.0)
MCH: 30.7 pg (ref 26.0–34.0)
MCHC: 33.9 g/dL (ref 30.0–36.0)
MCV: 90.5 fL (ref 78.0–100.0)
Platelets: 193 10*3/uL (ref 150–400)
RBC: 5.05 MIL/uL (ref 4.22–5.81)
RDW: 12.9 % (ref 11.5–15.5)
WBC: 7.5 10*3/uL (ref 4.0–10.5)

## 2014-02-26 SURGERY — OPEN REDUCTION INTERNAL FIXATION (ORIF) METACARPAL
Anesthesia: Regional | Site: Hand | Laterality: Left

## 2014-02-26 MED ORDER — HYDROMORPHONE HCL PF 1 MG/ML IJ SOLN: 0.2500 mg | INTRAMUSCULAR | Status: DC | PRN

## 2014-02-26 MED ORDER — OXYCODONE HCL 5 MG PO TABS: 5.0000 mg | ORAL_TABLET | Freq: Once | ORAL | Status: DC | PRN

## 2014-02-26 MED ORDER — PROPOFOL 10 MG/ML IV BOLUS
INTRAVENOUS | Status: AC
  Filled 2014-02-26: qty 20

## 2014-02-26 MED ORDER — PROPOFOL INFUSION 10 MG/ML OPTIME
INTRAVENOUS | Status: DC | PRN
  Administered 2014-02-26: 75 ug/kg/min via INTRAVENOUS

## 2014-02-26 MED ORDER — METHOCARBAMOL 500 MG PO TABS: 500.0000 mg | ORAL_TABLET | Freq: Three times a day (TID) | ORAL | Status: AC | PRN

## 2014-02-26 MED ORDER — MIDAZOLAM HCL 2 MG/2ML IJ SOLN
INTRAMUSCULAR | Status: AC
  Filled 2014-02-26: qty 2

## 2014-02-26 MED ORDER — FENTANYL CITRATE 0.05 MG/ML IJ SOLN
INTRAMUSCULAR | Status: AC
  Administered 2014-02-26: 100 ug
  Filled 2014-02-26: qty 2

## 2014-02-26 MED ORDER — 0.9 % SODIUM CHLORIDE (POUR BTL) OPTIME
TOPICAL | Status: DC | PRN
Start: 2014-02-26 — End: 2014-02-26
  Administered 2014-02-26: 1000 mL

## 2014-02-26 MED ORDER — FENTANYL CITRATE 0.05 MG/ML IJ SOLN
INTRAMUSCULAR | Status: DC | PRN
  Administered 2014-02-26 (×2): 50 ug via INTRAVENOUS

## 2014-02-26 MED ORDER — MIDAZOLAM HCL 5 MG/5ML IJ SOLN
INTRAMUSCULAR | Status: DC | PRN
Start: 2014-02-26 — End: 2014-02-26
  Administered 2014-02-26: 2 mg via INTRAVENOUS

## 2014-02-26 MED ORDER — OXYCODONE HCL 5 MG PO TABS: 5.0000 mg | ORAL_TABLET | ORAL | Status: AC | PRN

## 2014-02-26 MED ORDER — FENTANYL CITRATE 0.05 MG/ML IJ SOLN
INTRAMUSCULAR | Status: AC
  Filled 2014-02-26: qty 5

## 2014-02-26 MED ORDER — LIDOCAINE HCL (CARDIAC) 20 MG/ML IV SOLN
INTRAVENOUS | Status: DC | PRN
  Administered 2014-02-26: 50 mg via INTRAVENOUS

## 2014-02-26 MED ORDER — OXYCODONE HCL 5 MG/5ML PO SOLN: 5.0000 mg | Freq: Once | ORAL | Status: DC | PRN

## 2014-02-26 MED ORDER — LACTATED RINGERS IV SOLN
INTRAVENOUS | Status: DC | PRN
  Administered 2014-02-26: 15:00:00 via INTRAVENOUS

## 2014-02-26 MED ORDER — MIDAZOLAM HCL 2 MG/2ML IJ SOLN
INTRAMUSCULAR | Status: AC
  Administered 2014-02-26: 2 mg
  Filled 2014-02-26: qty 2

## 2014-02-26 MED ORDER — PROMETHAZINE HCL 25 MG/ML IJ SOLN: 6.2500 mg | INTRAMUSCULAR | Status: DC | PRN

## 2014-02-26 MED ORDER — LACTATED RINGERS IV SOLN
INTRAVENOUS | Status: DC
  Administered 2014-02-26: 14:00:00 via INTRAVENOUS

## 2014-02-26 MED ORDER — LIDOCAINE-EPINEPHRINE (PF) 1.5 %-1:200000 IJ SOLN
INTRAMUSCULAR | Status: DC | PRN
  Administered 2014-02-26: 30 mL via PERINEURAL

## 2014-02-26 SURGICAL SUPPLY — 62 items
BANDAGE ELASTIC 3 VELCRO ST LF (GAUZE/BANDAGES/DRESSINGS) ×3 IMPLANT
BANDAGE ELASTIC 4 VELCRO ST LF (GAUZE/BANDAGES/DRESSINGS) ×3 IMPLANT
BANDAGE GAUZE ELAST BULKY 4 IN (GAUZE/BANDAGES/DRESSINGS) ×3 IMPLANT
BIT DRILL 2X3.5 HAND QK RELEAS (BIT) ×1 IMPLANT
BIT DRILL 2X3.5 QUICK RELEASE (BIT) ×3
BLADE 10 SAFETY STRL DISP (BLADE) ×3 IMPLANT
BLADE SURG ROTATE 9660 (MISCELLANEOUS) IMPLANT
BNDG ESMARK 4X9 LF (GAUZE/BANDAGES/DRESSINGS) ×3 IMPLANT
CORDS BIPOLAR (ELECTRODE) ×3 IMPLANT
COUNTERSINK MULTI SCREW (BIT) ×3
COVER SURGICAL LIGHT HANDLE (MISCELLANEOUS) ×3 IMPLANT
CUFF TOURNIQUET SINGLE 18IN (TOURNIQUET CUFF) ×3 IMPLANT
CUFF TOURNIQUET SINGLE 24IN (TOURNIQUET CUFF) IMPLANT
DECANTER SPIKE VIAL GLASS SM (MISCELLANEOUS) IMPLANT
DRAPE OEC MINIVIEW 54X84 (DRAPES) IMPLANT
DRAPE U-SHAPE 47X51 STRL (DRAPES) ×3 IMPLANT
ELECT CAUTERY BLADE 6.4 (BLADE) ×3 IMPLANT
FACESHIELD WRAPAROUND (MASK) IMPLANT
GAUZE XEROFORM 1X8 LF (GAUZE/BANDAGES/DRESSINGS) ×3 IMPLANT
GLOVE BIOGEL PI IND STRL 6.5 (GLOVE) ×2 IMPLANT
GLOVE BIOGEL PI INDICATOR 6.5 (GLOVE) ×4
GLOVE ECLIPSE 6.5 STRL STRAW (GLOVE) ×3 IMPLANT
GLOVE SURG SS PI 7.5 STRL IVOR (GLOVE) ×9 IMPLANT
GOWN STRL REUS W/ TWL LRG LVL3 (GOWN DISPOSABLE) ×3 IMPLANT
GOWN STRL REUS W/ TWL XL LVL3 (GOWN DISPOSABLE) ×2 IMPLANT
GOWN STRL REUS W/TWL LRG LVL3 (GOWN DISPOSABLE) ×6
GOWN STRL REUS W/TWL XL LVL3 (GOWN DISPOSABLE) ×4
KIT BASIN OR (CUSTOM PROCEDURE TRAY) ×3 IMPLANT
KIT ROOM TURNOVER OR (KITS) ×3 IMPLANT
MANIFOLD NEPTUNE II (INSTRUMENTS) ×3 IMPLANT
NEEDLE 22X1 1/2 (OR ONLY) (NEEDLE) IMPLANT
NS IRRIG 1000ML POUR BTL (IV SOLUTION) ×3 IMPLANT
PACK ORTHO EXTREMITY (CUSTOM PROCEDURE TRAY) ×3 IMPLANT
PAD ARMBOARD 7.5X6 YLW CONV (MISCELLANEOUS) ×6 IMPLANT
PAD CAST 4YDX4 CTTN HI CHSV (CAST SUPPLIES) ×1 IMPLANT
PADDING CAST ABS 4INX4YD NS (CAST SUPPLIES) ×2
PADDING CAST ABS COTTON 4X4 ST (CAST SUPPLIES) ×1 IMPLANT
PADDING CAST COTTON 4X4 STRL (CAST SUPPLIES) ×2
SCREW BONE LAG 2.3X10MM HEXA (Screw) ×1 IMPLANT
SCREW BONE LAG 2.3X12MM HEXA (Screw) ×1 IMPLANT
SCREW BONE LAG 2.3X8MM HEXA (Screw) ×1 IMPLANT
SCREW BONE LAG 2.3X9MM HEXAQ (Screw) ×1 IMPLANT
SCREW COUNTERSINK MULTI SCREW (BIT) ×1 IMPLANT
SCREW LAG 2.3X10MM (Screw) ×3 IMPLANT
SCREW LAG 2.3X12MM (Screw) ×3 IMPLANT
SCREW LAG 2.3X8MM (Screw) ×3 IMPLANT
SCREW LAG 2.3X9MM (Screw) ×3 IMPLANT
SLING ARM FOAM STRAP MED (SOFTGOODS) ×3 IMPLANT
SPLINT FIBERGLASS 3X12 (CAST SUPPLIES) ×3 IMPLANT
SPONGE GAUZE 4X4 12PLY (GAUZE/BANDAGES/DRESSINGS) ×3 IMPLANT
SPONGE LAP 4X18 X RAY DECT (DISPOSABLE) IMPLANT
SUT ETHILON 4 0 PS 2 18 (SUTURE) ×3 IMPLANT
SUT MNCRL AB 4-0 PS2 18 (SUTURE) ×3 IMPLANT
SUT PROLENE 3 0 PS 2 (SUTURE) IMPLANT
SUT VIC AB 3-0 FS2 27 (SUTURE) ×3 IMPLANT
SYR CONTROL 10ML LL (SYRINGE) IMPLANT
TOWEL OR 17X24 6PK STRL BLUE (TOWEL DISPOSABLE) ×3 IMPLANT
TOWEL OR 17X26 10 PK STRL BLUE (TOWEL DISPOSABLE) ×3 IMPLANT
TUBE CONNECTING 12'X1/4 (SUCTIONS) ×1
TUBE CONNECTING 12X1/4 (SUCTIONS) ×2 IMPLANT
UNDERPAD 30X30 INCONTINENT (UNDERPADS AND DIAPERS) ×3 IMPLANT
WATER STERILE IRR 1000ML POUR (IV SOLUTION) ×3 IMPLANT

## 2014-02-26 NOTE — Anesthesia Preprocedure Evaluation (Addendum)
Anesthesia Evaluation  Patient identified by MRN, date of birth, ID band Patient awake    Reviewed: Allergy & Precautions, H&P , NPO status   Airway Mallampati: I      Dental  (+) Teeth Intact, Dental Advidsory Given   Pulmonary Current Smoker,  breath sounds clear to auscultation        Cardiovascular negative cardio ROS  Rhythm:Regular Rate:Normal     Neuro/Psych    GI/Hepatic negative GI ROS, Neg liver ROS,   Endo/Other  negative endocrine ROS  Renal/GU negative Renal ROS     Musculoskeletal   Abdominal   Peds  Hematology negative hematology ROS (+)   Anesthesia Other Findings   Reproductive/Obstetrics                         Anesthesia Physical Anesthesia Plan  ASA: I  Anesthesia Plan: Regional   Post-op Pain Management:    Induction: Intravenous  Airway Management Planned: Natural Airway  Additional Equipment:   Intra-op Plan:   Post-operative Plan: Extubation in OR  Informed Consent: I have reviewed the patients History and Physical, chart, labs and discussed the procedure including the risks, benefits and alternatives for the proposed anesthesia with the patient or authorized representative who has indicated his/her understanding and acceptance.   Dental advisory given and Dental Advisory Given  Plan Discussed with: CRNA, Surgeon and Anesthesiologist  Anesthesia Plan Comments:       Anesthesia Quick Evaluation

## 2014-02-26 NOTE — H&P (Signed)
   ORTHOPAEDIC HISTORY AND PHYSICAL   Chief Complaint: Left 5th MC fx  HPI: Caleb Vazquez is a 33 y.o. male who presents today for surgical treatment of left 5th MC fx.  Denies any changes in medical history.  Past Medical History  Diagnosis Date  . History of migraine     dealt with as a teenager;none in about 2236yrs ago  . Numbness     right leg.02/25/14- resolved after surgery  . Chronic back pain     HNP.  02/25/14- resolved after surgery  . Medical history non-contributory    Past Surgical History  Procedure Laterality Date  . Lumbar laminectomy Right 07/26/2013    Procedure: MICRODISCECTOMY LUMBAR LAMINECTOMY;  Surgeon: Eldred MangesMark C Yates, MD;  [facility];  Service: Orthopedics;  Laterality: Right;  Right L5-S1 Microdiscectomy   History   Social History  . Marital Status: Single    Spouse Name: N/A    Number of Children: N/A  . Years of Education: N/A   Social History Main Topics  . Smoking status: Current Every Day Smoker -- 0.50 packs/day for 20 years  . Smokeless tobacco: None  . Alcohol Use: Yes     Comment: occasionally  . Drug Use: No  . Sexual Activity: No   Other Topics Concern  . None   Social History Narrative  . None   History reviewed. No pertinent family history. No Known Allergies Prior to Admission medications   Medication Sig Start Date End Date Taking? Authorizing Provider  amoxicillin-clavulanate (AUGMENTIN) 875-125 MG per tablet Take 1 tablet by mouth 2 (two) times daily. Starting on 4/25 for 10 days   Yes Historical Provider, MD  cholecalciferol (VITAMIN D) 1000 UNITS tablet Take 1,000 Units by mouth daily.   Yes Historical Provider, MD  ibuprofen (ADVIL,MOTRIN) 800 MG tablet Take 800 mg by mouth daily.   Yes Historical Provider, MD  vitamin C (ASCORBIC ACID) 500 MG tablet Take 500 mg by mouth daily.   Yes Historical Provider, MD   No results found.  Positive ROS: All other systems have been reviewed and were otherwise negative with  the exception of those mentioned in the HPI and as above.  Physical Exam: General: Alert, no acute distress Cardiovascular: No pedal edema Respiratory: No cyanosis, no use of accessory musculature GI: No organomegaly, abdomen is soft and non-tender Skin: No lesions in the area of chief complaint Neurologic: Sensation intact distally Psychiatric: Patient is competent for consent with normal mood and affect Lymphatic: No axillary or cervical lymphadenopathy  MUSCULOSKELETAL:  - exam stable - no skin lesions around planned surgery  Assessment: Left 5th MC fx  Plan: - discussed r/b/a again with patient and he voiced understanding and wishes to proceed - consent signed - plan to dc home after surgery   N. Glee ArvinMichael Xu, MD [facility] [phone] 10:01 AM

## 2014-02-26 NOTE — Discharge Instructions (Signed)
1. Keep splint clean and dry 2. Do not use hand under any circumstances.  Strict non weight bearing 3. Take medicines as prescribed 4. Keep hand elevated above level of the heart 5. Follow up with Dr. Roda ShuttersXu in 2 weeks

## 2014-02-26 NOTE — Anesthesia Procedure Notes (Addendum)
Anesthesia Regional Block:  Supraclavicular block  Pre-Anesthetic Checklist: ,, timeout performed, Correct Patient, Correct Site, Correct Laterality, Correct Procedure, Correct Position, site marked, Risks and benefits discussed,  Surgical consent,  Pre-op evaluation,  At surgeon's request and post-op pain management  Laterality: Left and Upper  Prep: chloraprep       Needles:   Needle Type: Echogenic Needle     Needle Length: 9cm 9 cm Needle Gauge: 22 and 22 G  Needle insertion depth: 5 cm   Additional Needles:  Procedures: ultrasound guided (picture in chart) and nerve stimulator Supraclavicular block Narrative:  Start time: 02/26/2014 3:15 PM End time: 02/26/2014 3:25 PM Injection made incrementally with aspirations every 5 mL.  Performed by: Personally  Anesthesiologist: T Massagee  Additional Notes: Tolerated well   Procedure Name: MAC Date/Time: 02/26/2014 3:58 PM Performed by: Carmela RimaMARTINELLI, Khasir Woodrome F Oxygen Delivery Method: Simple face mask Placement Confirmation: positive ETCO2

## 2014-02-26 NOTE — Transfer of Care (Signed)
Immediate Anesthesia Transfer of Care Note  Patient: Caleb Vazquez  Procedure(s) Performed: Procedure(s): OPEN REDUCTION INTERNAL FIXATION (ORIF) LEFT 5TH METACARPAL (Left)  Patient Location: PACU  Anesthesia Type:MAC and MAC combined with regional for post-op pain  Level of Consciousness: sedated  Airway & Oxygen Therapy: Patient Spontanous Breathing and Patient connected to face mask oxygen  Post-op Assessment: Report given to PACU RN and Post -op Vital signs reviewed and stable  Post vital signs: Reviewed and stable  Complications: No apparent anesthesia complications

## 2014-02-26 NOTE — Op Note (Signed)
   Date of Surgery: 02/26/2014  INDICATIONS: Caleb Vazquez is a 33 y.o.-year-old male who sustained a left 5th metacarpal fracture from a dog bite;  The patient did consent to the procedure after discussion of the risks and benefits.  PREOPERATIVE DIAGNOSIS: left 5th metacarpal fracture  POSTOPERATIVE DIAGNOSIS: Same.  PROCEDURE: open treatment of left 5th metacarpal fracture  SURGEON: N. Glee ArvinMichael Nakhi Choi, M.D.  ASSIST: none.  ANESTHESIA:  general  IV FLUIDS AND URINE: See anesthesia.  ESTIMATED BLOOD LOSS: minimal mL.  IMPLANTS: Acumed 2.3 mm partially threaded screws x 2 (10 mm, 12 mm)  DRAINS: none  COMPLICATIONS: None.  DESCRIPTION OF PROCEDURE: The patient was brought to the operating room and placed supine on the operating table.  The patient had been signed prior to the procedure and this was documented. The patient had the anesthesia placed by the anesthesiologist.  A time-out was performed to confirm that this was the correct patient, site, side and location. The patient had an SCD on the opposite lower extremity. The patient did receive antibiotics prior to the incision and was re-dosed during the procedure as needed at indicated intervals.  A nonsterile tourniquet was placed on the upper arm.  The patient had the operative extremity prepped and draped in the standard surgical fashion.    The extremity was exsanguinated using Esmarch bandage. The tourniquet was inflated to 250 mm mercury.  The fracture site was palpated. A longitudinal incision based over the ulnar aspect of the fifth metacarpal was used. Superficial veins were cauterized using bipolar cautery. A branch of the dorsal ulnar sensory nerve was encountered. This was mobilized and protected throughout the case. I then sharply elevated the periosteum off of the fifth metacarpal. The fracture was exposed. Any entrapped hematoma and periosteum was taken out of the fracture site. Once this was done I was able to achieve  reduction of the fracture using manual maneuvers and a tenaculum clamp. I then confirmed the fracture reduction under fluoroscopy. Because of the fracture orientation I placed 2 partially threaded 2.3 mm screws from a dorsal to volar direction.  I was able to achieve good bicortical purchase. I directly visualized compression of the fracture. Once the screws were placed by took final x-rays to confirm proper hardware placement and maintenance of the fracture reduction. The wound was then thoroughly irrigated using normal saline. The tourniquet was deflated and hemostasis was obtained. The wounds closed in layer fashion using 3-0 Vicryl for the deep skin layer and a running 4-0 Monocryl for the skin. Steri-Strips were placed a sterile dressing was applied. The hand was then placed in a ulnar gutter splint. The patient tolerated the procedure well and was transferred to the PACU in stable condition.  POSTOPERATIVE PLAN: The patient will be discharged home. He will be strictly nonweightbearing to the left upper extremity. We will see him back in the office in 2 weeks for a wound check and conversion to a cast.  Mayra ReelN. Michael Lenell Lama, MD [facility] [phone] 10:03 AM

## 2014-02-28 NOTE — Anesthesia Postprocedure Evaluation (Signed)
  Anesthesia Post-op Note  Patient: Caleb Vazquez  Procedure(s) Performed: Procedure(s): OPEN REDUCTION INTERNAL FIXATION (ORIF) LEFT 5TH METACARPAL (Left)  Patient Location: PACU  Anesthesia Type:Regional  Level of Consciousness: awake and alert   Airway and Oxygen Therapy: Patient Spontanous Breathing  Post-op Pain: none  Post-op Assessment: Post-op Vital signs reviewed  Post-op Vital Signs: stable  Last Vitals:  Filed Vitals:   02/26/14 1745  BP: 110/74  Pulse: 55  Temp: 36.7 C  Resp: 18    Complications: No apparent anesthesia complications

## 2014-03-03 ENCOUNTER — Encounter (HOSPITAL_COMMUNITY): Payer: Self-pay | Admitting: Orthopaedic Surgery

## 2015-12-10 IMAGING — CR DG HAND COMPLETE 3+V*L*
3 series · 3 of 3 positions shown · non-contrast
Comparison: None

CLINICAL DATA: Animal bite several days ago, swelling, loss of
motion

EXAM:
LEFT HAND - COMPLETE 3+ VIEW

[x hand pa left]
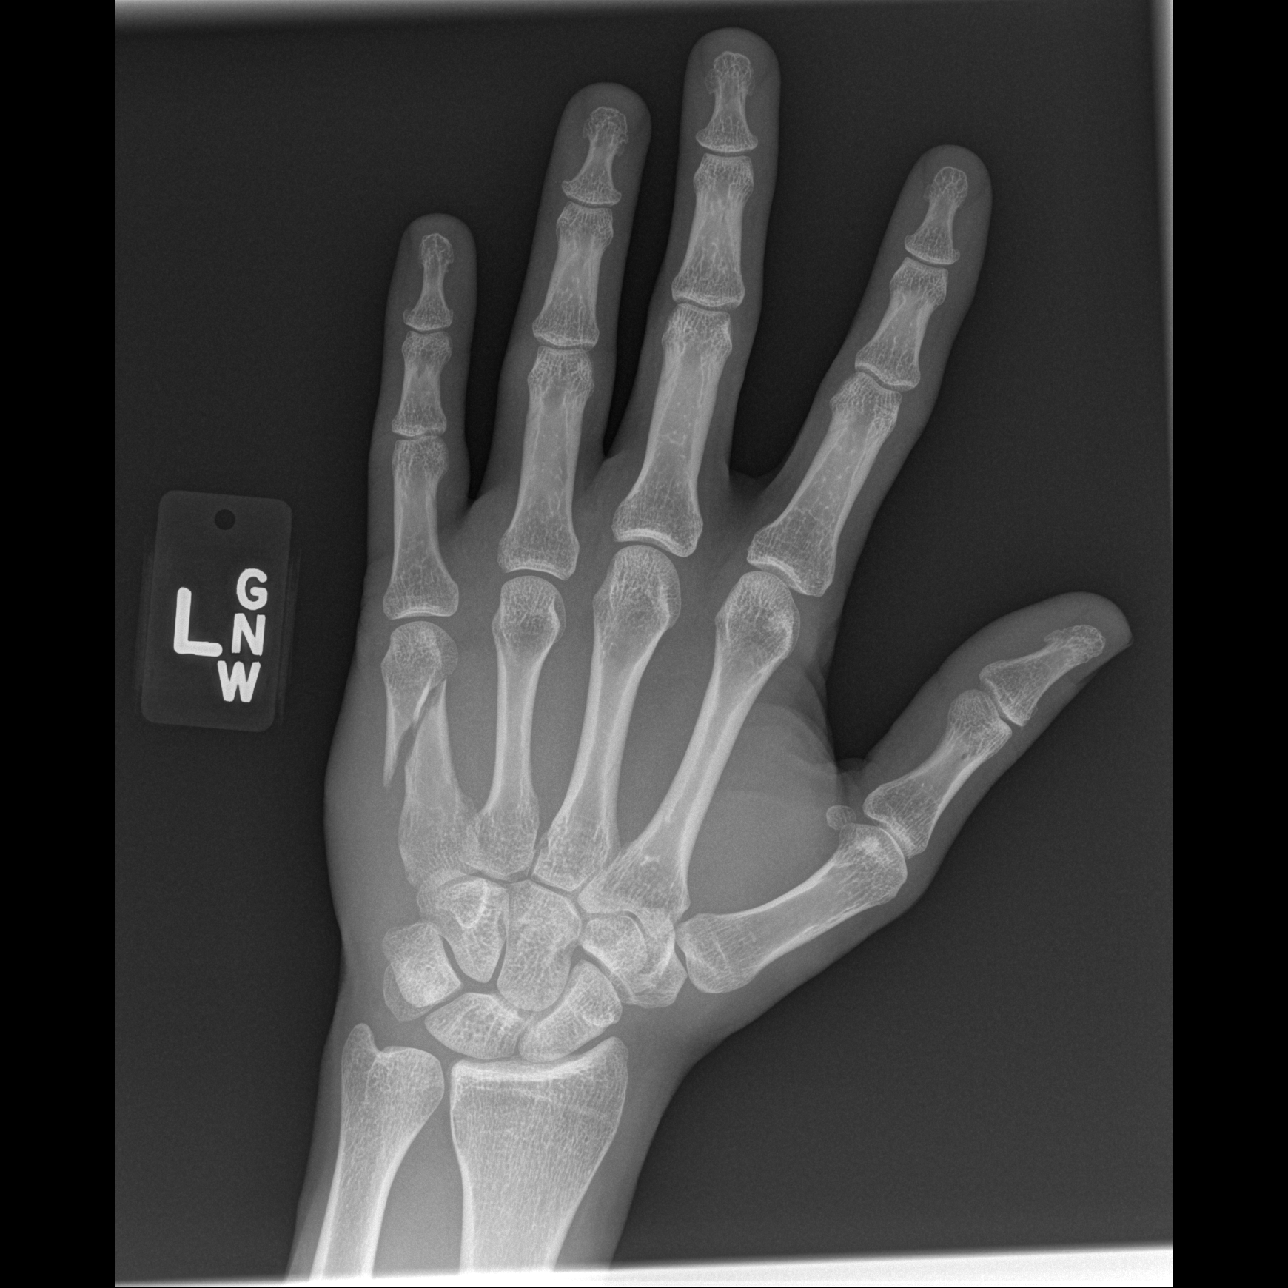

[x hand oblique left]
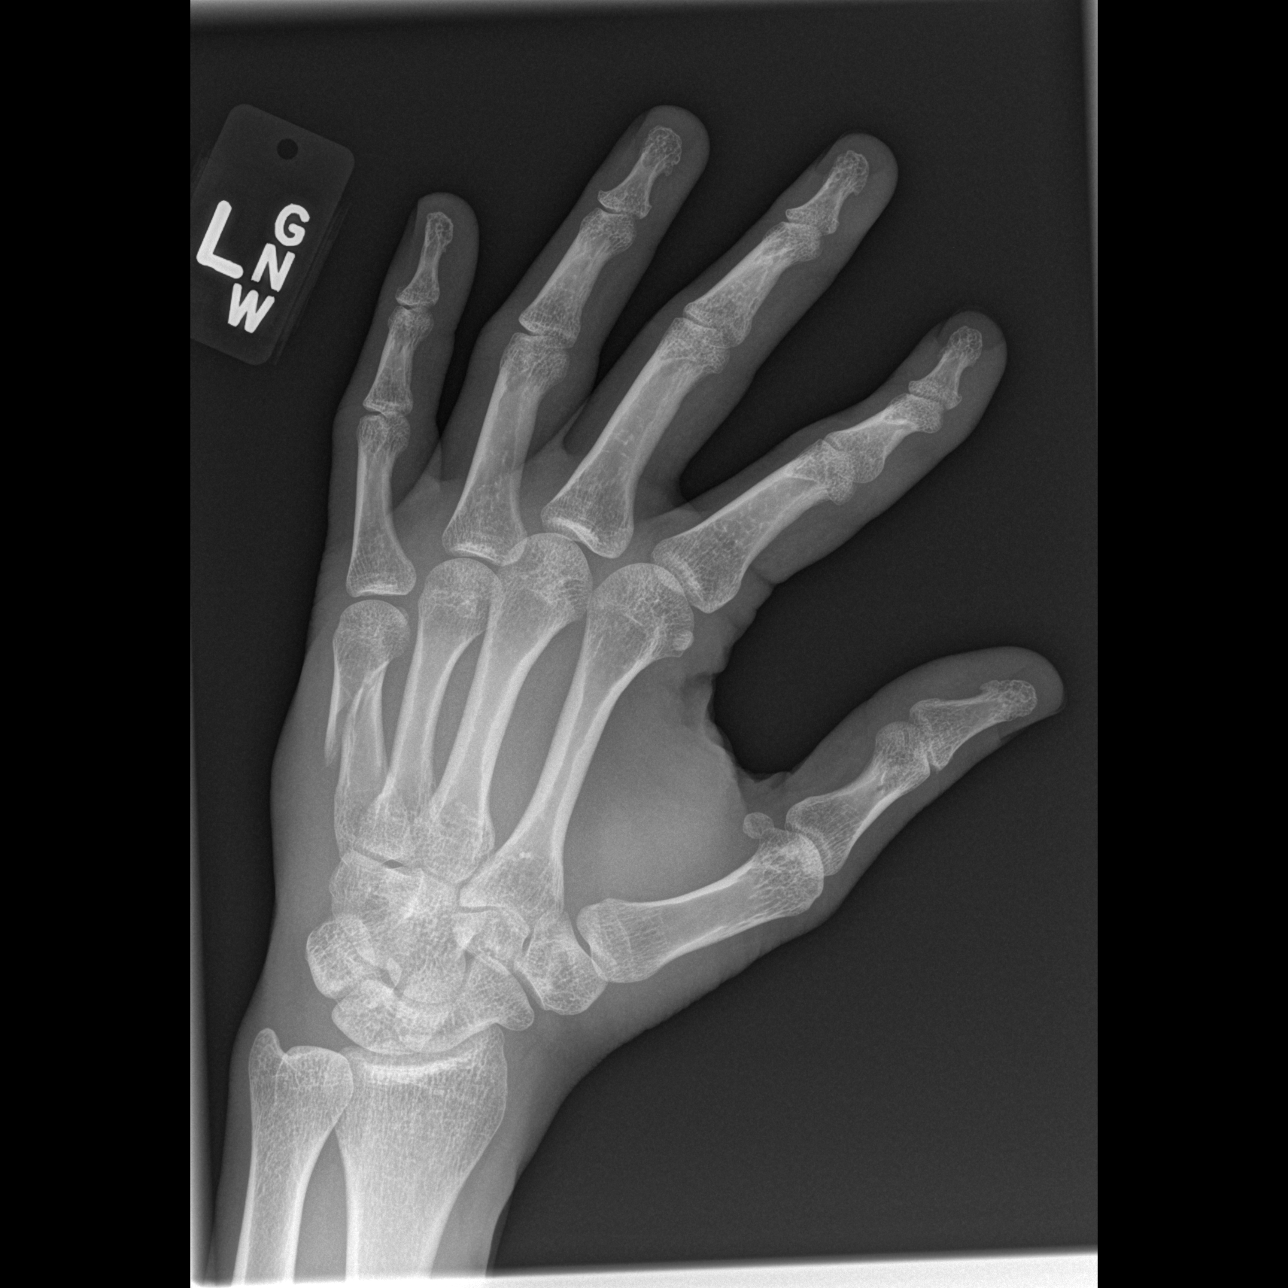

[x hand lat left]
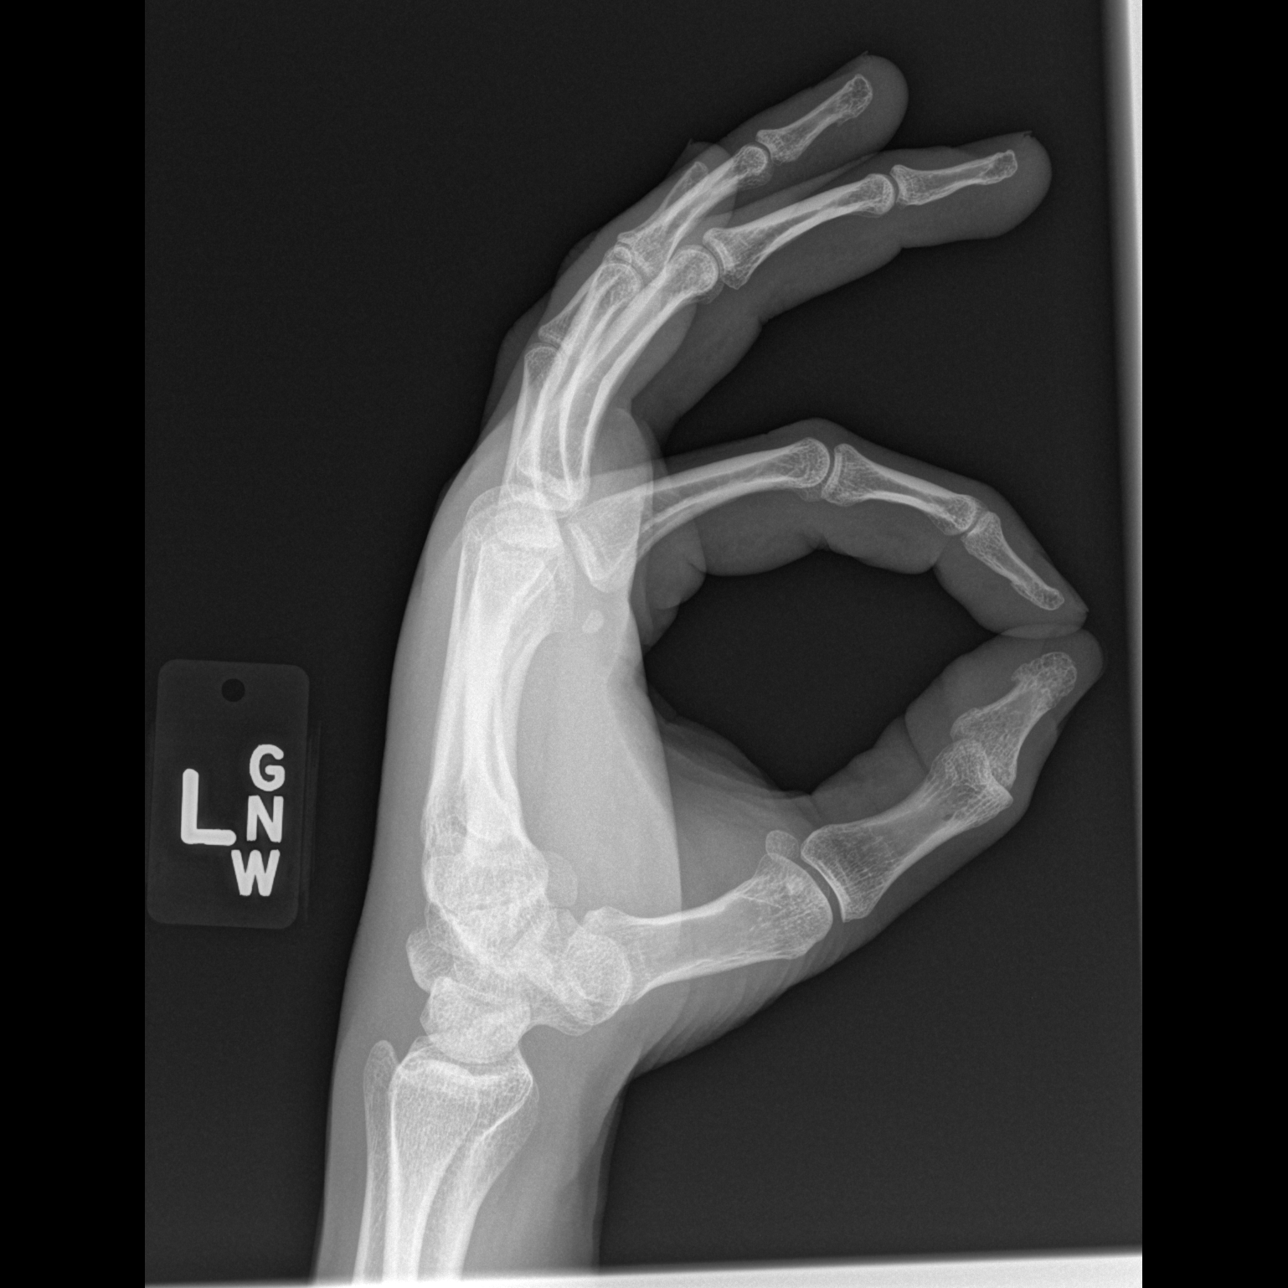

[3 of 3 positions shown; findings below may reference images not displayed]

FINDINGS: Osseous mineralization normal.

Joint spaces preserved.

Scattered soft tissue swelling.

Oblique displaced fracture of the distal fifth metacarpal shaft with
overriding and foreshortening.

No additional fracture, dislocation or bone destruction.

No radiopaque foreign body or soft tissue gas.
IMPRESSION: Displaced angulated distal left fifth metacarpal diaphyseal fracture
with foreshortening.

## 2016-09-15 ENCOUNTER — Telehealth (INDEPENDENT_AMBULATORY_CARE_PROVIDER_SITE_OTHER): Payer: Self-pay | Admitting: Orthopaedic Surgery

## 2016-09-15 DIAGNOSIS — M5136 Other intervertebral disc degeneration, lumbar region: Secondary | ICD-10-CM

## 2016-09-15 NOTE — Telephone Encounter (Signed)
Refill prednisone 

## 2016-09-15 NOTE — Telephone Encounter (Signed)
Please advise. You saw patient 06/03/16 for low back pain and prescribed Hydrocodone and a Prednisone taper.

## 2016-09-15 NOTE — Telephone Encounter (Signed)
Pt. Called stating he is in a lot of pain and is wanting the pain medication

## 2016-09-16 ENCOUNTER — Telehealth (INDEPENDENT_AMBULATORY_CARE_PROVIDER_SITE_OTHER): Payer: Self-pay | Admitting: Radiology

## 2016-09-16 DIAGNOSIS — M545 Low back pain: Secondary | ICD-10-CM

## 2016-09-16 MED ORDER — PREDNISONE 10 MG PO TABS: 10.0000 mg | ORAL_TABLET | ORAL | 0 refills | Status: DC

## 2016-09-16 MED ORDER — HYDROCODONE-ACETAMINOPHEN 5-325 MG PO TABS: 1.0000 | ORAL_TABLET | Freq: Four times a day (QID) | ORAL | 0 refills | Status: AC | PRN

## 2016-09-16 NOTE — Telephone Encounter (Signed)
We have not seen him in 2 months.  Ok for 15 norco 5/325  1 po q 6 hrs prn

## 2016-09-16 NOTE — Telephone Encounter (Signed)
Called prednisone to pharmacy. Left voicemail for patient advising.

## 2016-09-16 NOTE — Telephone Encounter (Signed)
Called patient. Script up front for pick up

## 2016-09-16 NOTE — Telephone Encounter (Signed)
Called to pharmacy 

## 2016-09-16 NOTE — Telephone Encounter (Signed)
Patient left a message regarding prednisone that had been called in. He states that he has been taking the prednisone and it is not providing any relief. He is requesting a refill on the Hydrocodone. He states that he has been in tears the past few nights. Please advise.

## 2018-08-16 ENCOUNTER — Emergency Department
Admission: EM | Admit: 2018-08-16 | Discharge: 2018-08-16 | Disposition: A | Discharge status: Home / Self Care | Payer: Managed Care, Other (non HMO) | Attending: Emergency Medicine | Admitting: Emergency Medicine

## 2018-08-16 ENCOUNTER — Encounter: Payer: Self-pay | Admitting: Emergency Medicine

## 2018-08-16 DIAGNOSIS — R21 Rash and other nonspecific skin eruption: Secondary | ICD-10-CM | POA: Diagnosis not present

## 2018-08-16 DIAGNOSIS — F17200 Nicotine dependence, unspecified, uncomplicated: Secondary | ICD-10-CM | POA: Insufficient documentation

## 2018-08-16 DIAGNOSIS — Z79899 Other long term (current) drug therapy: Secondary | ICD-10-CM | POA: Insufficient documentation

## 2018-08-16 MED ORDER — DOXYCYCLINE HYCLATE 50 MG PO CAPS: 100.0000 mg | ORAL_CAPSULE | Freq: Two times a day (BID) | ORAL | 0 refills | Status: AC

## 2018-08-16 MED ORDER — DIPHENHYDRAMINE HCL 25 MG PO CAPS: 25.0000 mg | ORAL_CAPSULE | ORAL | 0 refills | Status: AC | PRN

## 2018-08-16 MED ORDER — PREDNISONE 10 MG PO TABS: ORAL_TABLET | ORAL | 0 refills | Status: AC

## 2018-08-16 MED ORDER — FAMOTIDINE 20 MG PO TABS
20.0000 mg | ORAL_TABLET | Freq: Once | ORAL | Status: AC
  Administered 2018-08-16: 20 mg via ORAL
  Filled 2018-08-16: qty 1

## 2018-08-16 MED ORDER — METHYLPREDNISOLONE SODIUM SUCC 125 MG IJ SOLR
125.0000 mg | Freq: Once | INTRAMUSCULAR | Status: AC
  Administered 2018-08-16: 125 mg via INTRAMUSCULAR
  Filled 2018-08-16: qty 2

## 2018-08-16 MED ORDER — RANITIDINE HCL 150 MG PO TABS: 150.0000 mg | ORAL_TABLET | Freq: Two times a day (BID) | ORAL | 0 refills | Status: AC

## 2018-08-16 NOTE — ED Triage Notes (Signed)
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
## 2018-08-16 NOTE — ED Notes (Signed)
Observed in waiting room  Post injection

## 2018-08-16 NOTE — ED Notes (Signed)
Pt states felt like he was bitten by something last night. Itching with it. Mosquito type bumps on arm. Today he has patches of rash on same arm. States felt like his hand has cool and worried about circulation. Pulses palpated and compared and normal with no change to coloring or circulation noted.
